# Patient Record
Sex: Male | Born: 2000 | Race: Black or African American | Hispanic: No | Marital: Single | Smoking: Never smoker
Health system: Southern US, Community
[De-identification: ages and names within clinical notes are randomized; demographics above are authoritative.]

---
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
## 2021-04-29 IMAGING — CR DG FOOT COMPLETE 3+V*R*
3 series · 3 of 3 positions shown · non-contrast
Comparison: None.

CLINICAL DATA: C/o hit his Rt foot against a chair 2-3 wks ago and
still has pain at proximal phalanx of Rt 3rd toe

EXAM:
RIGHT FOOT COMPLETE - 3+ VIEW

[t foot ap right]
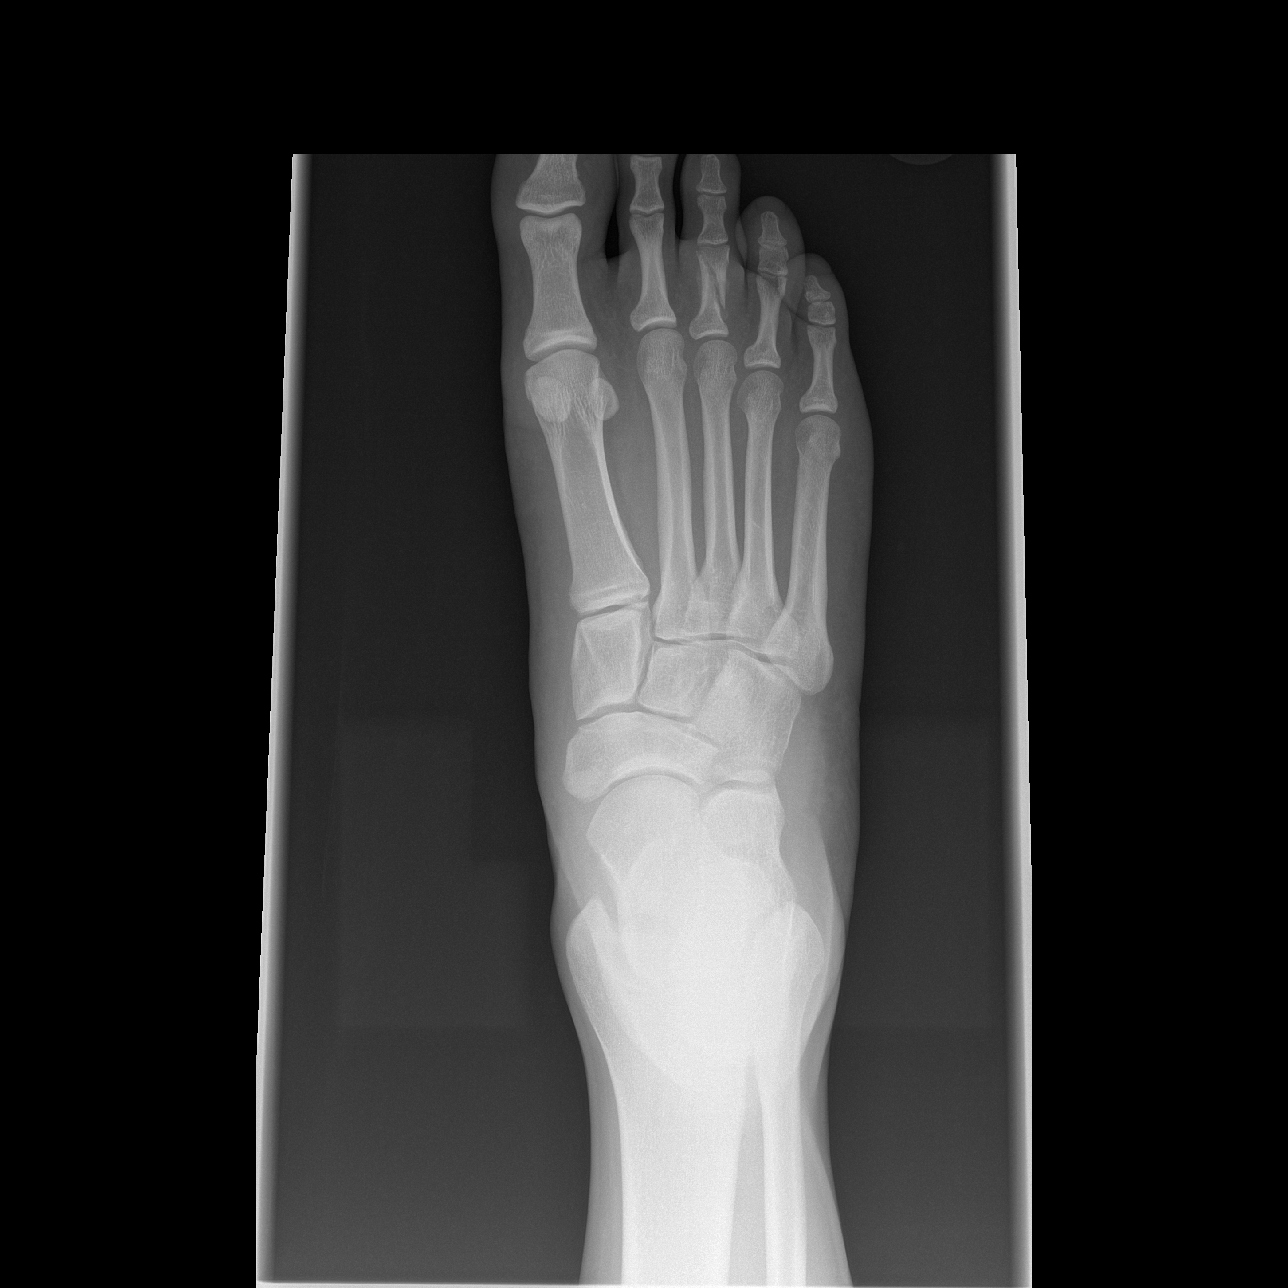

[t foot oblique right]
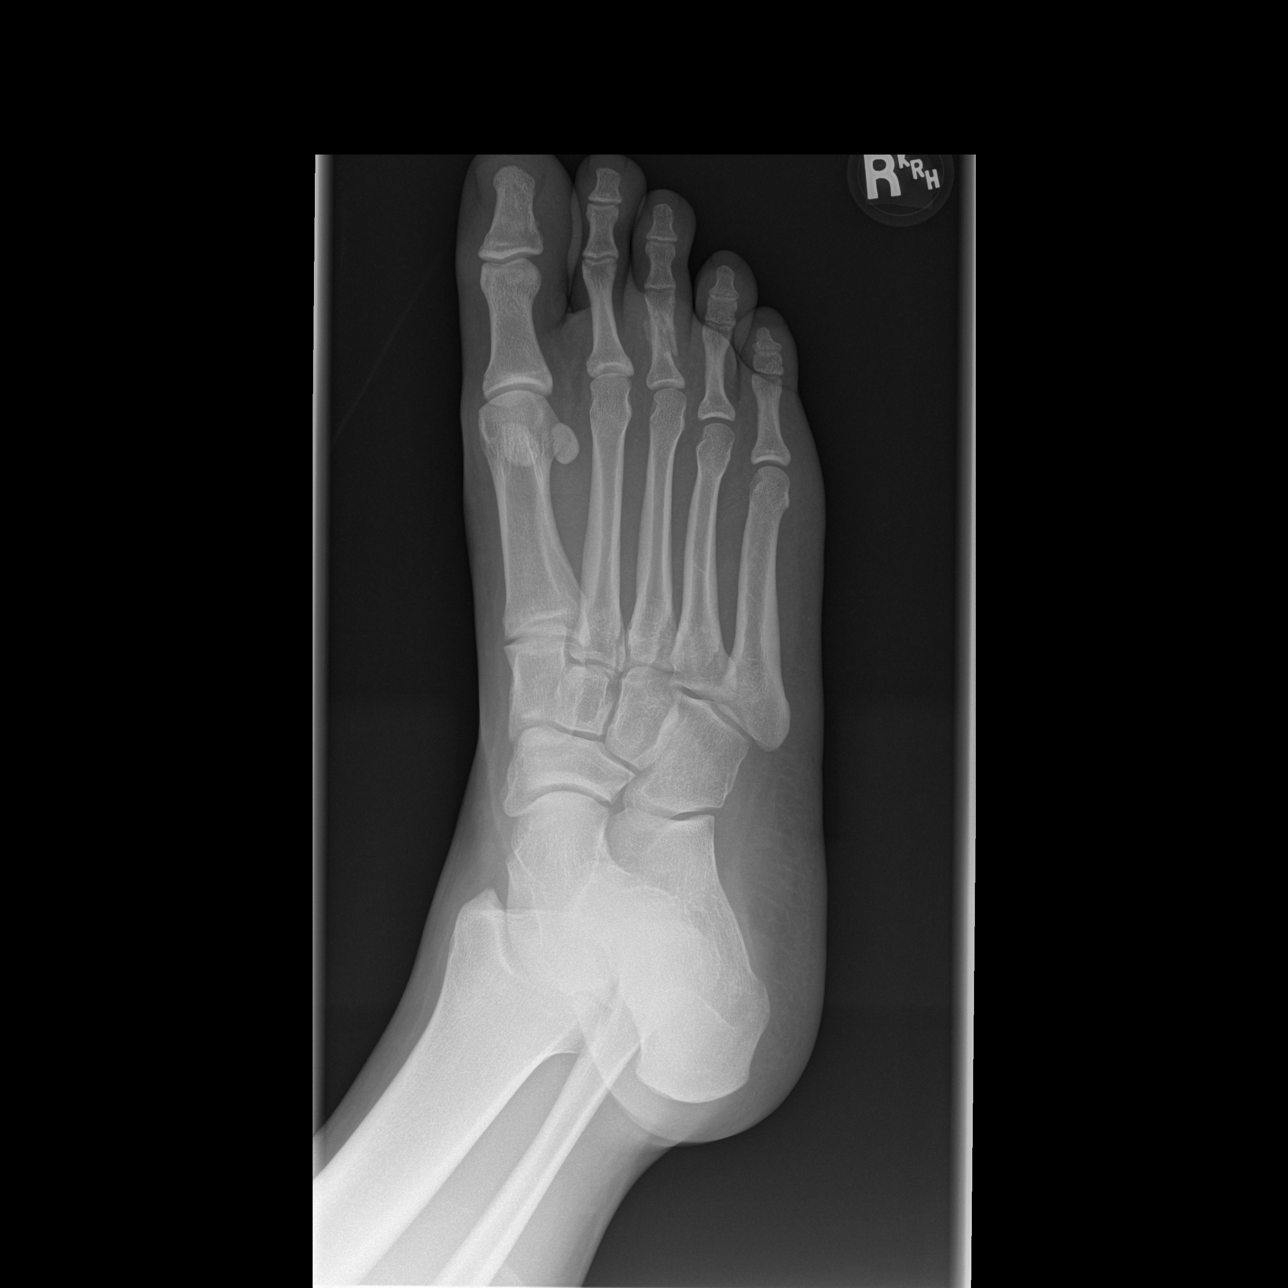

[t foot lat right]
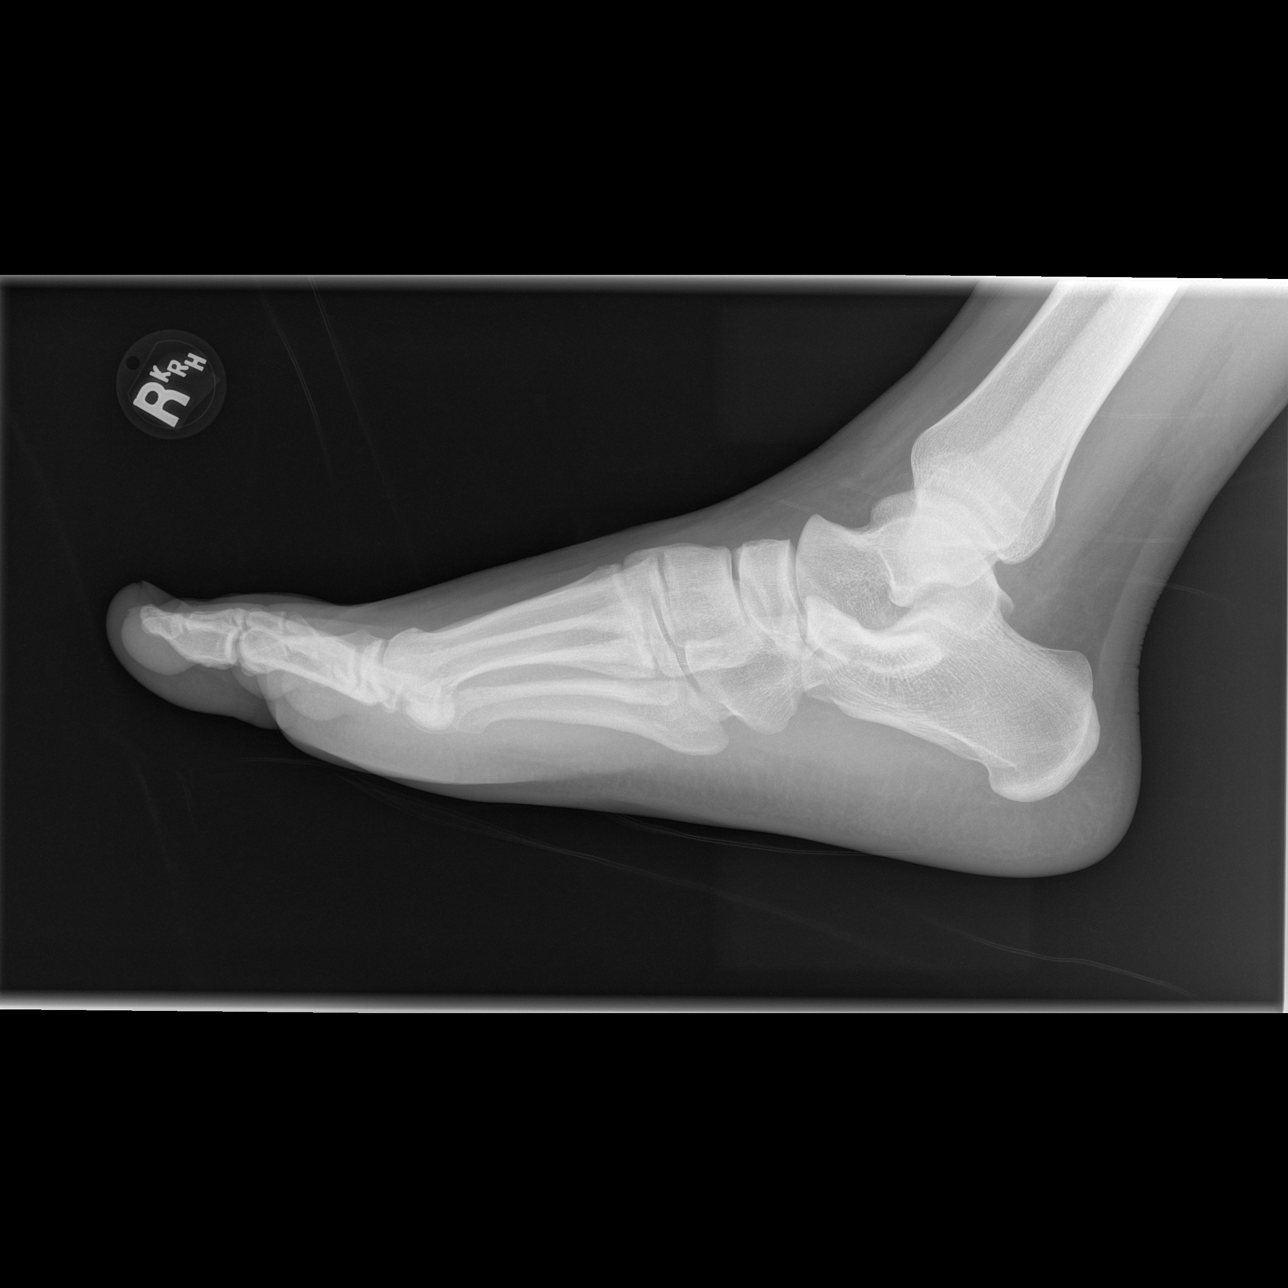

[3 of 3 positions shown; findings below may reference images not displayed]

FINDINGS: There is a minimally displaced oblique fracture through the proximal
phalanx of the third toe. No additional fractures identified. No
evidence of dislocation. There is evidence of early healing
including callus formation at the fracture site. Regional soft
tissues are unremarkable.
IMPRESSION: Healing minimally displaced fracture through the proximal phalanx of
the third toe of the right foot.

## 2023-02-25 ENCOUNTER — Encounter: Payer: Self-pay | Admitting: *Deleted
